# Patient Record
Sex: Male | Born: 1945 | Race: White | Marital: Married | Smoking: Never smoker
Health system: Southern US, Academic
[De-identification: ages and names within clinical notes are randomized; demographics above are authoritative.]

## PROBLEM LIST (undated history)

## (undated) DIAGNOSIS — I2510 Atherosclerotic heart disease of native coronary artery without angina pectoris: Secondary | ICD-10-CM

## (undated) DIAGNOSIS — G43909 Migraine, unspecified, not intractable, without status migrainosus: Secondary | ICD-10-CM

## (undated) DIAGNOSIS — I10 Essential (primary) hypertension: Secondary | ICD-10-CM

## (undated) DIAGNOSIS — E7800 Pure hypercholesterolemia, unspecified: Secondary | ICD-10-CM

## (undated) HISTORY — PX: HX NO SURGICAL PROCEDURES: 2100001501

## (undated) HISTORY — DX: Migraine, unspecified, not intractable, without status migrainosus: G43.909

## (undated) HISTORY — DX: Essential (primary) hypertension: I10

## (undated) HISTORY — DX: Pure hypercholesterolemia, unspecified: E78.00

## (undated) HISTORY — DX: Atherosclerotic heart disease of native coronary artery without angina pectoris: I25.10

---

## 1994-02-15 ENCOUNTER — Other Ambulatory Visit (HOSPITAL_COMMUNITY): Payer: Self-pay | Admitting: Family Medicine

## 2014-11-20 DIAGNOSIS — K635 Polyp of colon: Secondary | ICD-10-CM | POA: Insufficient documentation

## 2021-04-20 IMAGING — MR MRI LUMBAR SPINE WITHOUT CONTRAST
5 of 6 series · 33 of 48 positions shown · non-contrast
Comparison: None previous available.

﻿EXAM:  [id number]   MRI LUMBAR SPINE WITHOUT CONTRAST
INDICATION: 75-year-old with persistent low back pain.
TECHNIQUE: Axial, coronal and sagittal images including T1, T2 and STIR sequences.

[Series 6: T1 · sagittal · 4.0mm · 0.87mm/px · 6 of 13 slices shown (1 of 2)]
[im 1/13]
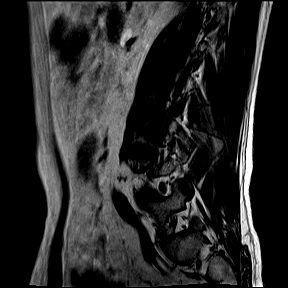
[im 3/13]
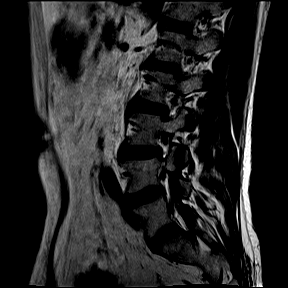
[im 5/13]
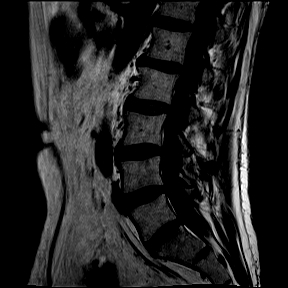
[im 8/13]
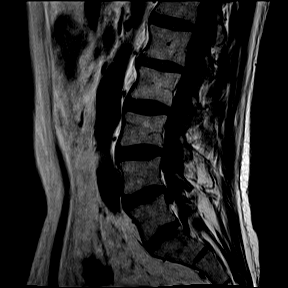
[im 10/13]
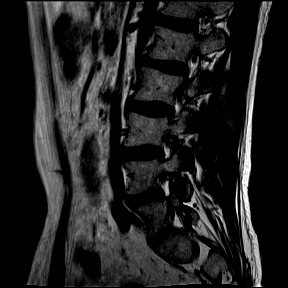
[im 13/13]
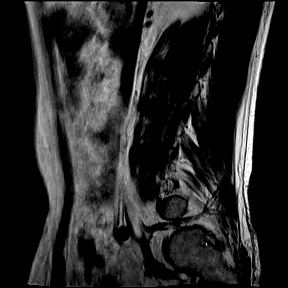

[Series 8: T2 · coronal · 5.0mm · 0.82mm/px · 8 of 18 slices shown (1 of 3)]
[im 1/18]
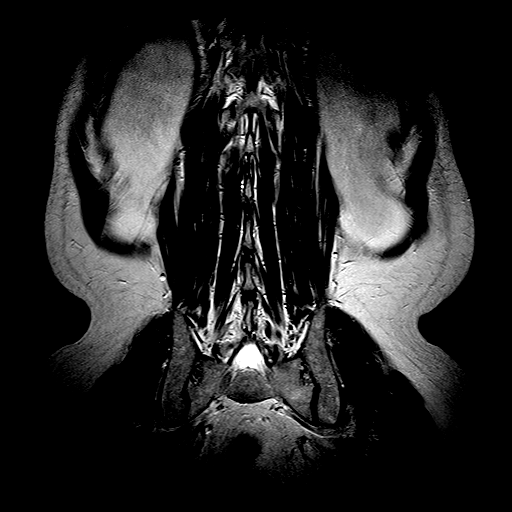
[im 3/18]
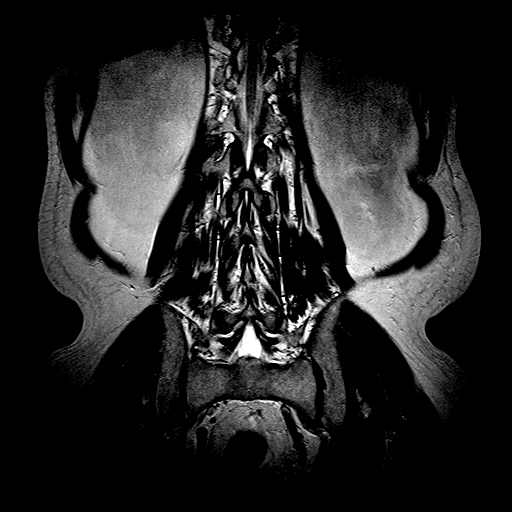
[im 5/18]
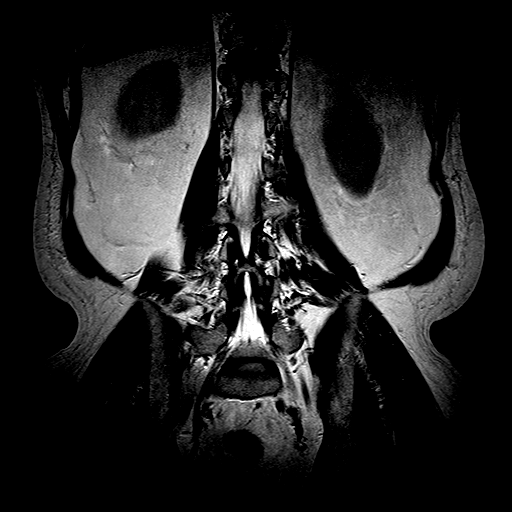
[im 8/18]
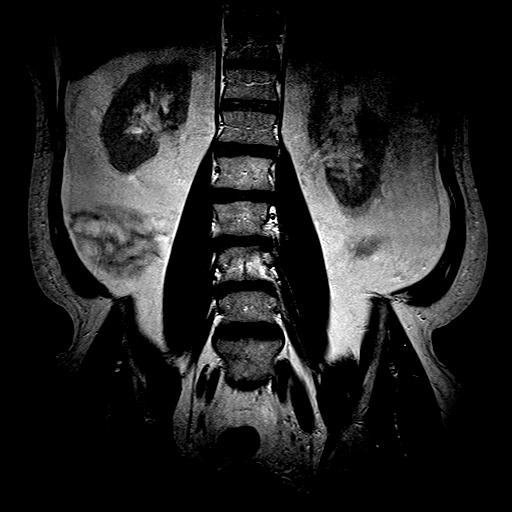
[im 10/18]
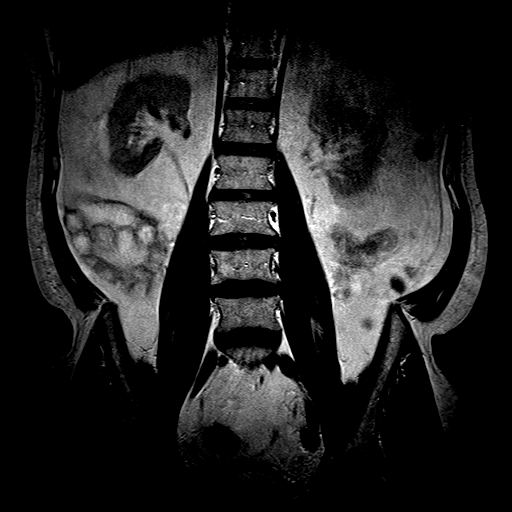
[im 13/18]
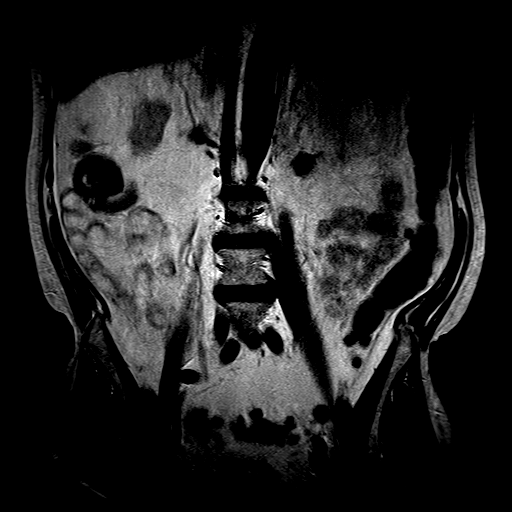
[im 15/18]
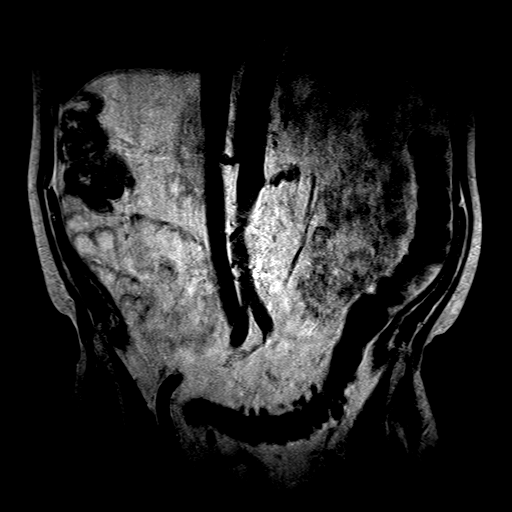
[im 18/18]
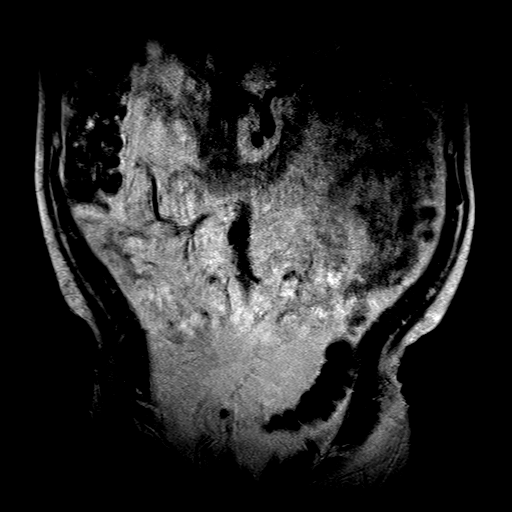

[Series 9: T2 · oblique · 4.0mm · 0.52mm/px · 11 of 23 slices shown (2 of 3)]
[im 1/23]
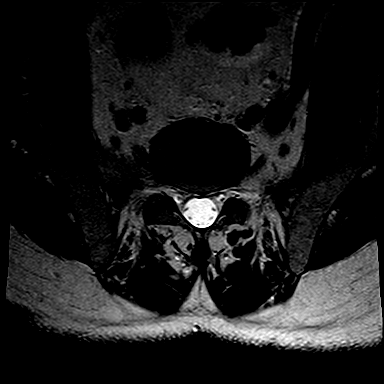
[im 3/23]
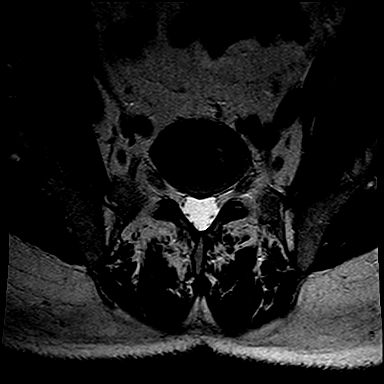
[im 5/23]
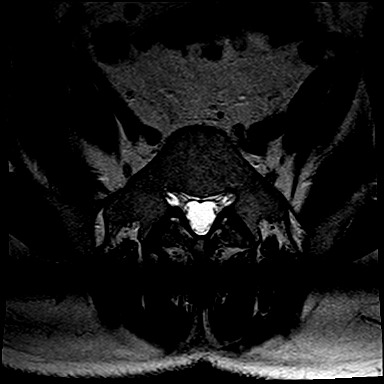
[im 7/23]
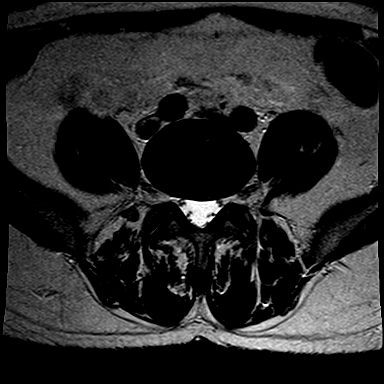
[im 9/23]
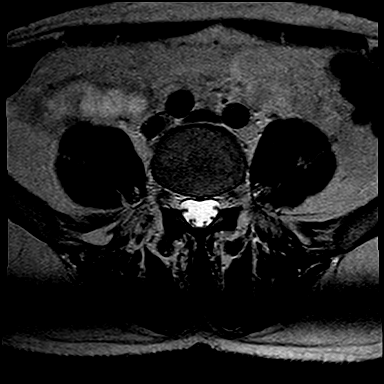
[im 12/23]
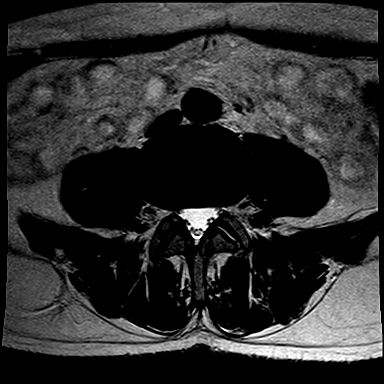
[im 14/23]
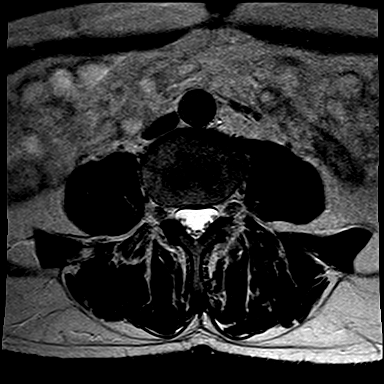
[im 16/23]
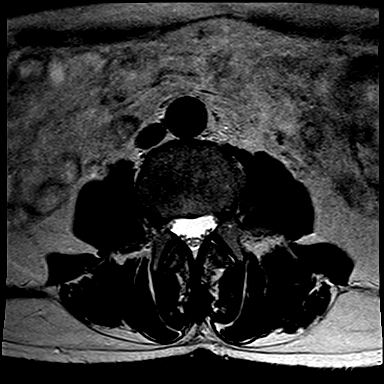
[im 18/23]
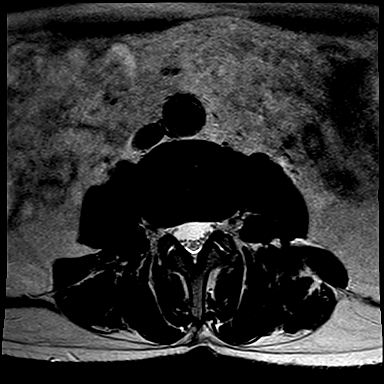
[im 20/23]
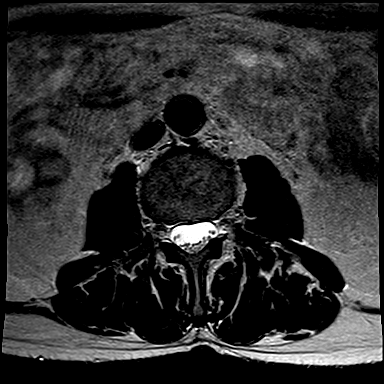
[im 23/23]
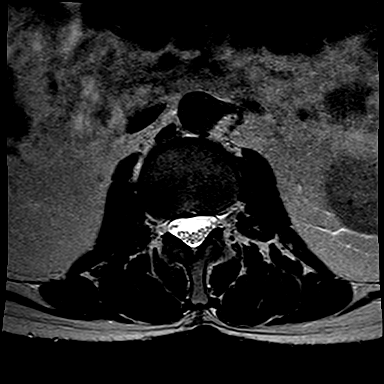

[Series 10: T2 · sagittal · 4.0mm · 0.87mm/px · 6 of 13 slices shown (3 of 3)]
[im 1/13]
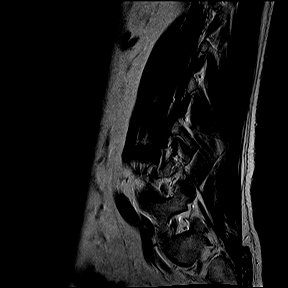
[im 3/13]
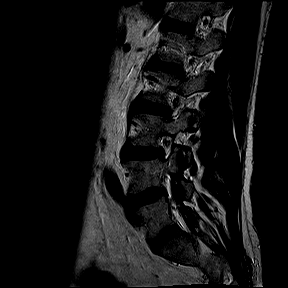
[im 5/13]
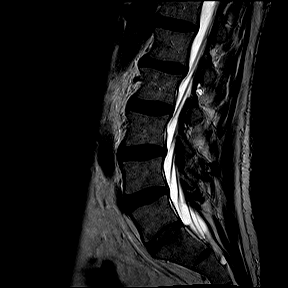
[im 8/13]
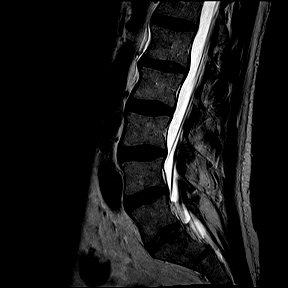
[im 10/13]
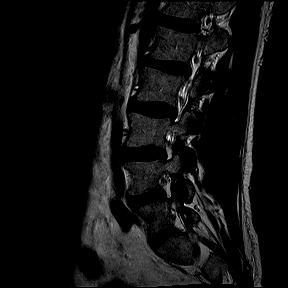
[im 13/13]
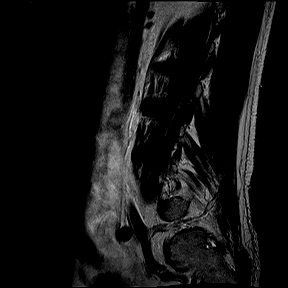

[Series 11: T1 · oblique · 4.0mm · 0.52mm/px · 2 of 23 slices shown (2 of 2)]
[im 1/23]
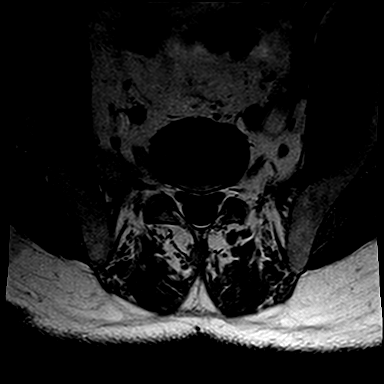
[im 5/23]
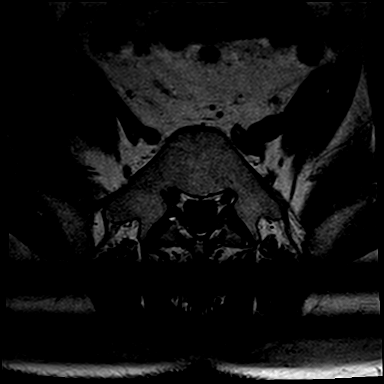

[33 of 48 positions shown; findings below may reference images not displayed]

FINDINGS: No acute bony lesions of lumbar vertebrae are seen.  Lower spinal cord and cauda equina are normal.  

At L1-L2 level, degenerative disc disease and facet arthropathy are causing mild compromise of thecal sac and both neural foramina.  Similar finding is noted at L2-L3 level and L3-L4 level as well.  

At L4-L5 level, degenerative disc changes and facet arthropathy are noted causing mild to moderate compromise of both neural foramina.  

At L5-S1 level, no focal disc lesions are seen.  

Paravertebral soft tissues are unremarkable.
IMPRESSION: 1. No acute bone changes of lumbar vertebrae. 

2. Multilevel degenerative disc changes and facet arthropathy causing mild to moderate degree of compromise of the lateral recesses and neural foramina at multiple levels as described above in detail.

## 2022-08-08 IMAGING — MR MRI KNEE RT W/O CONTRAST
5 series · 40 of 40 positions shown · IV contrast (gadolinium)
Comparison: No prior imaging studies of the knee are available for comparison.

﻿EXAM:  [id number]   MRI KNEE RT W/O CONTRAST
INDICATION: Acute onset of medial right knee pain. No history of trauma. No history of previous surgery.
TECHNIQUE: Multiplanar, multisequential MRI of the right knee was performed without gadolinium contrast.

[Series 5: PD fat-sat · axial · right · 4.0mm · 0.53mm/px · z∈[-57,+73]mm · 8 of 30 slices shown (1 of 3)]
[im 1/30]
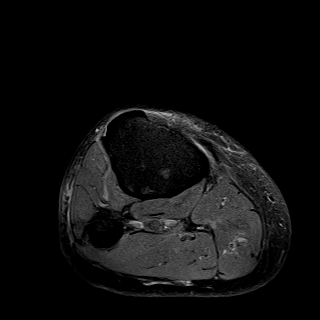
[im 5/30]
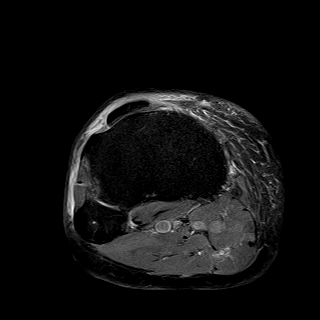
[im 9/30]
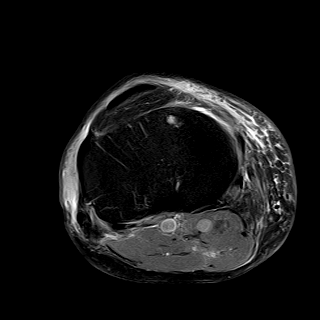
[im 13/30]
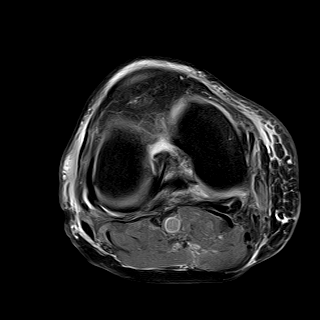
[im 17/30]
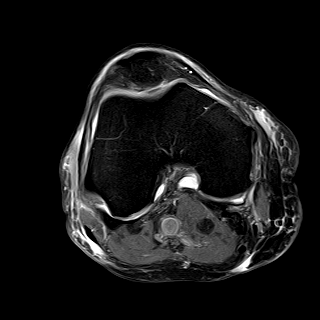
[im 21/30]
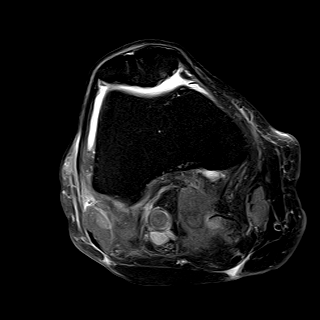
[im 25/30]
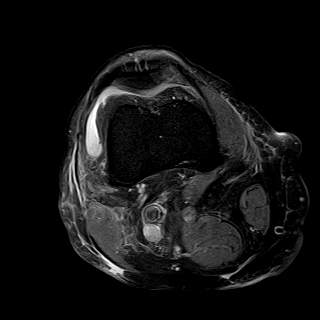
[im 30/30]
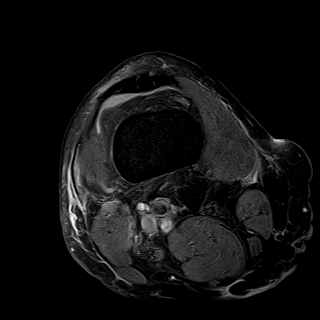

[Series 6: PD fat-sat · sagittal · right · 3.0mm · 0.47mm/px · 9 of 30 slices shown (2 of 3)]
[im 1/30]
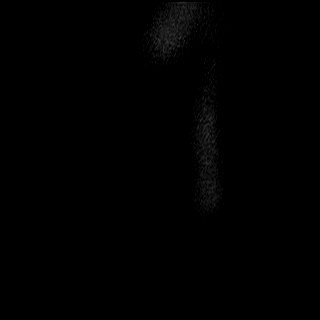
[im 4/30]
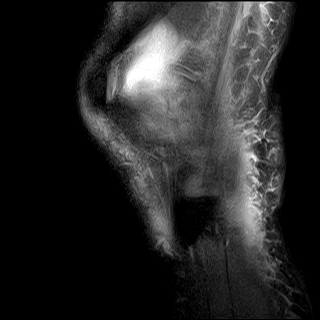
[im 8/30]
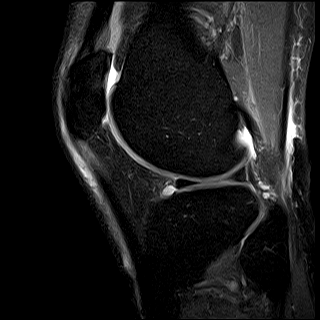
[im 11/30]
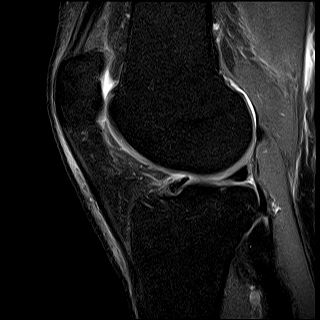
[im 15/30]
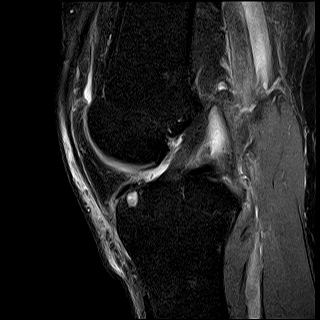
[im 19/30]
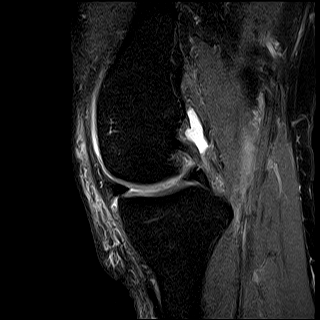
[im 22/30]
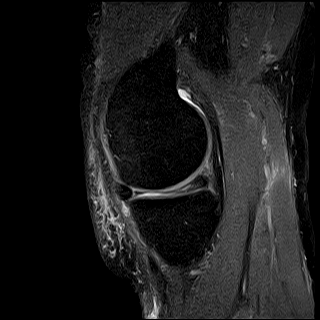
[im 26/30]
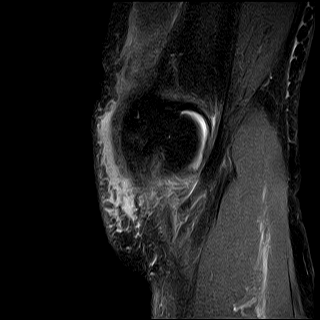
[im 30/30]
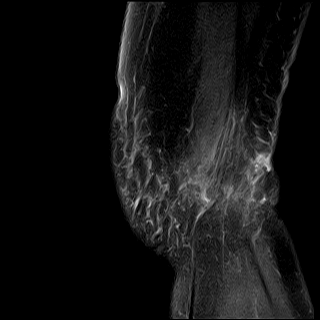

[Series 7: T1 · sagittal · right · 3.0mm · 0.39mm/px · 9 of 30 slices shown]
[im 1/30]
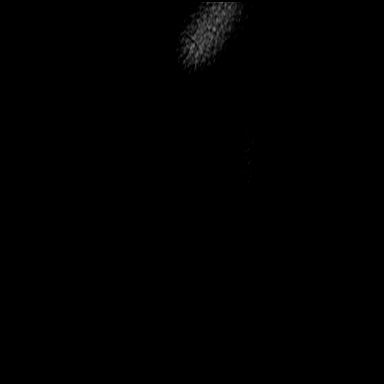
[im 4/30]
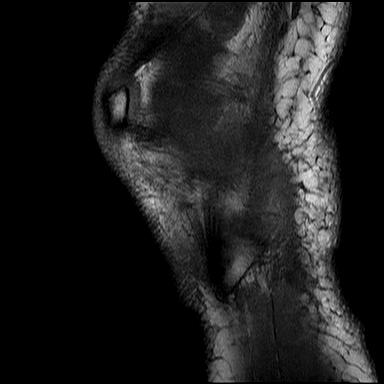
[im 8/30]
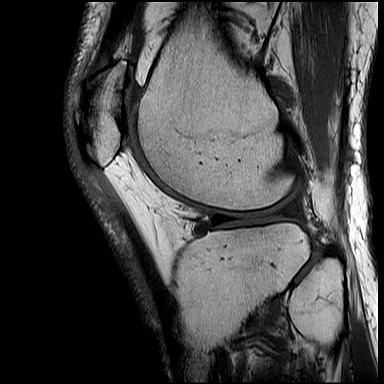
[im 11/30]
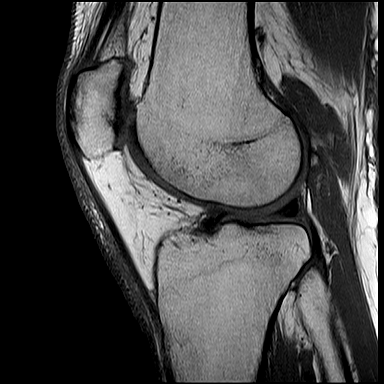
[im 15/30]
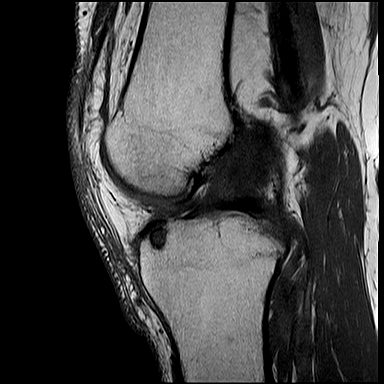
[im 19/30]
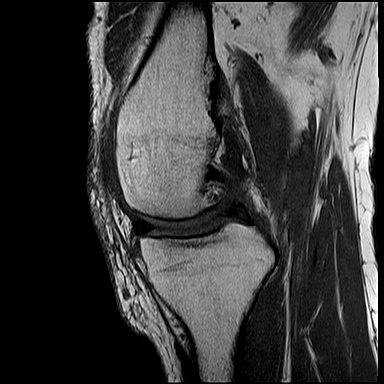
[im 22/30]
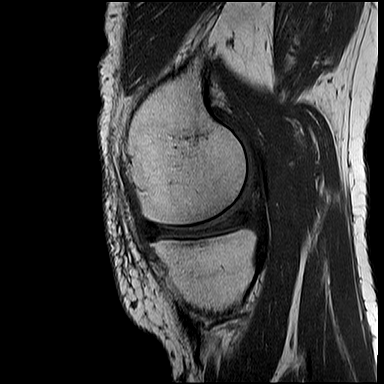
[im 26/30]
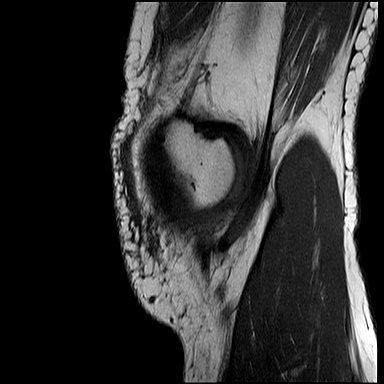
[im 30/30]
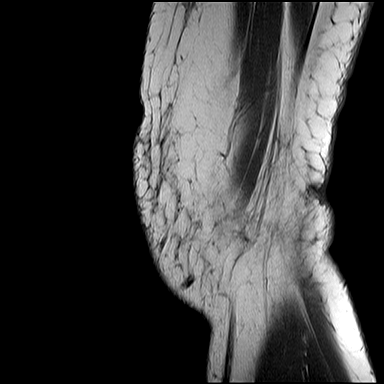

[Series 8: STIR · coronal · right · 3.5mm · 0.47mm/px · 7 of 22 slices shown]
[im 1/22]
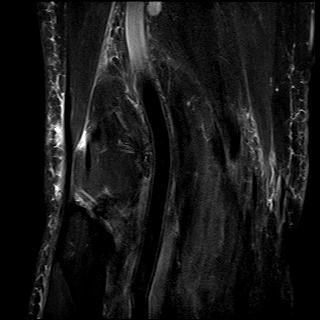
[im 4/22]
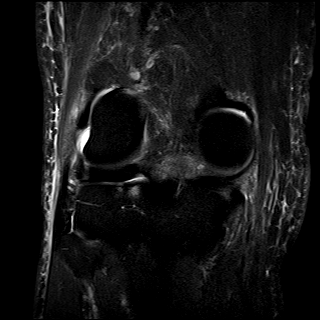
[im 8/22]
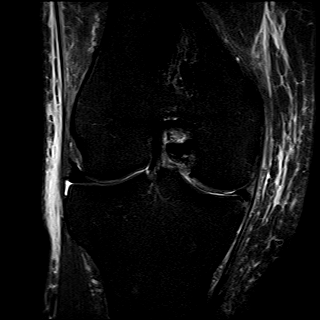
[im 11/22]
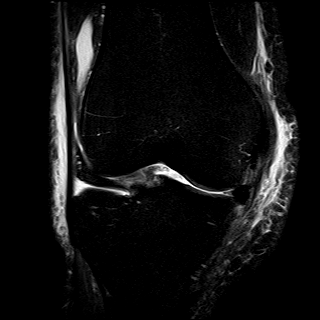
[im 15/22]
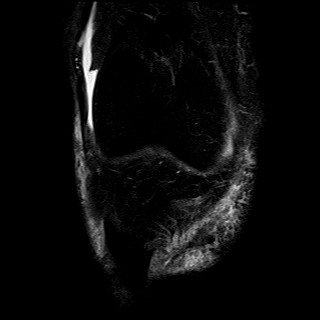
[im 18/22]
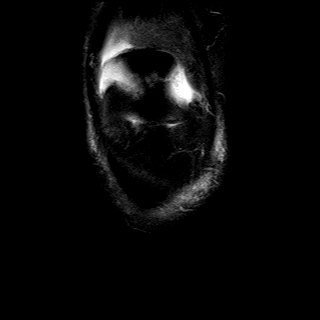
[im 22/22]
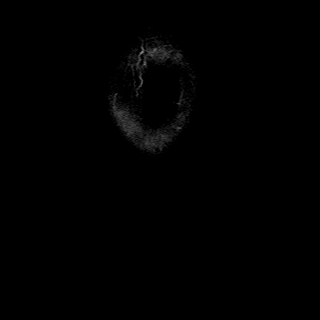

[Series 9: PD fat-sat · coronal · right · 3.5mm · 0.47mm/px · 7 of 22 slices shown (3 of 3)]
[im 1/22]
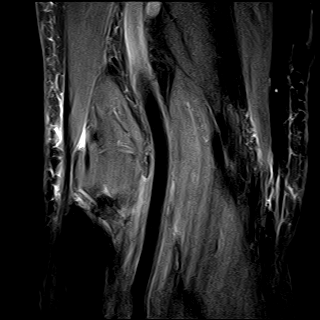
[im 4/22]
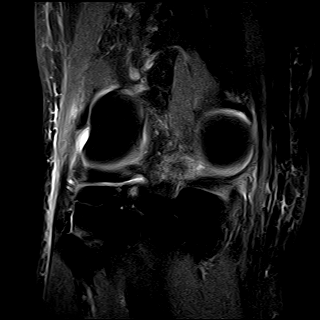
[im 8/22]
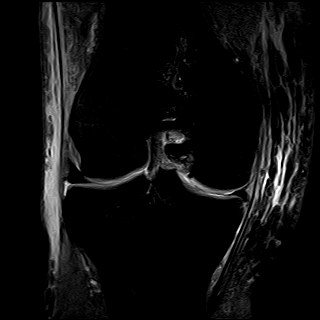
[im 11/22]
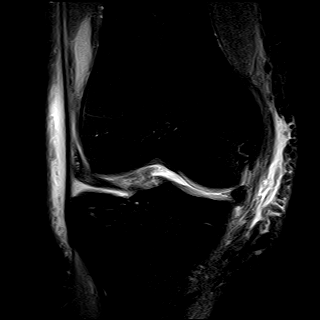
[im 15/22]
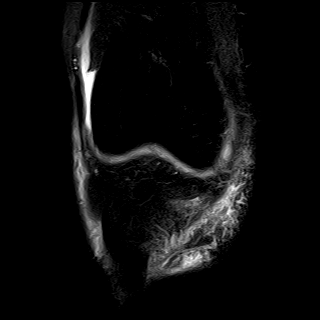
[im 18/22]
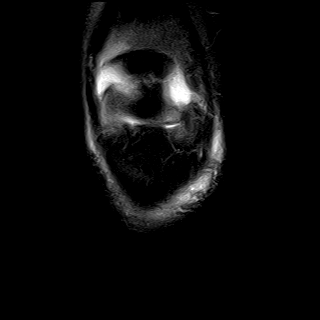
[im 22/22]
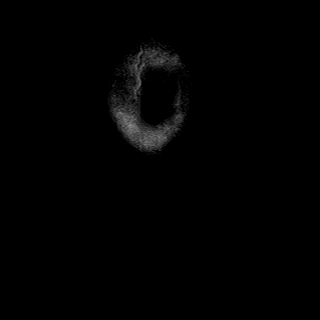

[40 of 40 positions shown; findings below may reference images not displayed]

FINDINGS: No acute fracture or bone bruise is seen on both sides of the right knee. 

Lateral meniscus and lateral articular cartilage are normal.  Anterior and posterior cruciate ligaments are intact. 

The medial meniscus shows chronic degenerative change and chronic nondisplaced tear of the mid and posterior horn of the medial meniscus. Mild medial extrusion of mid medial meniscus.  Meniscal root is intact.

Grade 3 to grade 4 degenerative changes of medial articular cartilage over the medial tibial plateau.

Medial collateral ligament is intact. Soft tissue bruise and edema is noted superficial to the medial collateral ligament.

Mild patella alta.  Significant grade 3 to grade 4 degenerative changes of patellar articular cartilage.  Small effusion is noted in the knee joint.
IMPRESSION: 1. No acute bone changes at the right knee. 

2. The medial meniscus shows chronic degenerative change and chronic nondisplaced tear of the mid and posterior horn of the medial meniscus. Mild medial extrusion of mid medial meniscus.  Meniscal root is intact.

3. Significant, grade 3 to grade 4 degenerative changes of medial articular cartilage over the medial tibial plateau and patellofemoral articular cartilage are noted. Mild patella alta.

4. Edema of the subcutaneous tissues on the medial aspect of the knee. Medial collateral ligament is intact.

## 2023-04-25 ENCOUNTER — Encounter (INDEPENDENT_AMBULATORY_CARE_PROVIDER_SITE_OTHER): Payer: Self-pay | Admitting: Internal Medicine

## 2023-04-25 ENCOUNTER — Other Ambulatory Visit: Payer: Medicare Other | Attending: Internal Medicine | Admitting: Internal Medicine

## 2023-04-25 ENCOUNTER — Other Ambulatory Visit: Payer: Self-pay

## 2023-04-25 ENCOUNTER — Ambulatory Visit (INDEPENDENT_AMBULATORY_CARE_PROVIDER_SITE_OTHER): Payer: Medicare Other | Admitting: Internal Medicine

## 2023-04-25 VITALS — BP 118/73 | HR 57 | Temp 97.8°F | Resp 20 | Ht 73.0 in | Wt 211.0 lb

## 2023-04-25 DIAGNOSIS — I2510 Atherosclerotic heart disease of native coronary artery without angina pectoris: Secondary | ICD-10-CM

## 2023-04-25 DIAGNOSIS — I10 Essential (primary) hypertension: Secondary | ICD-10-CM

## 2023-04-25 DIAGNOSIS — K5790 Diverticulosis of intestine, part unspecified, without perforation or abscess without bleeding: Secondary | ICD-10-CM

## 2023-04-25 DIAGNOSIS — I340 Nonrheumatic mitral (valve) insufficiency: Secondary | ICD-10-CM

## 2023-04-25 DIAGNOSIS — D696 Thrombocytopenia, unspecified: Secondary | ICD-10-CM | POA: Insufficient documentation

## 2023-04-25 DIAGNOSIS — N400 Enlarged prostate without lower urinary tract symptoms: Secondary | ICD-10-CM | POA: Insufficient documentation

## 2023-04-25 DIAGNOSIS — Z86010 Personal history of colonic polyps: Secondary | ICD-10-CM

## 2023-04-25 DIAGNOSIS — M1990 Unspecified osteoarthritis, unspecified site: Secondary | ICD-10-CM

## 2023-04-25 DIAGNOSIS — M47816 Spondylosis without myelopathy or radiculopathy, lumbar region: Secondary | ICD-10-CM

## 2023-04-25 DIAGNOSIS — E785 Hyperlipidemia, unspecified: Secondary | ICD-10-CM

## 2023-04-25 DIAGNOSIS — I7121 Aneurysm of the ascending aorta, without rupture (CMS HCC): Secondary | ICD-10-CM

## 2023-04-25 DIAGNOSIS — R946 Abnormal results of thyroid function studies: Secondary | ICD-10-CM

## 2023-04-25 DIAGNOSIS — K8020 Calculus of gallbladder without cholecystitis without obstruction: Secondary | ICD-10-CM

## 2023-04-25 DIAGNOSIS — N281 Cyst of kidney, acquired: Secondary | ICD-10-CM

## 2023-04-25 LAB — COMPREHENSIVE METABOLIC PANEL, NON-FASTING
ALBUMIN/GLOBULIN RATIO: 1.9 — ABNORMAL HIGH (ref 0.8–1.4)
ALBUMIN: 4.5 g/dL (ref 3.5–5.7)
ALKALINE PHOSPHATASE: 57 U/L (ref 34–104)
ALT (SGPT): 17 U/L (ref 7–52)
ANION GAP: 5 mmol/L (ref 4–13)
AST (SGOT): 21 U/L (ref 13–39)
BILIRUBIN TOTAL: 1 mg/dL (ref 0.3–1.2)
BUN/CREA RATIO: 16 (ref 6–22)
BUN: 16 mg/dL (ref 7–25)
CALCIUM, CORRECTED: 9.1 mg/dL (ref 8.9–10.8)
CALCIUM: 9.5 mg/dL (ref 8.6–10.3)
CHLORIDE: 108 mmol/L — ABNORMAL HIGH (ref 98–107)
CO2 TOTAL: 27 mmol/L (ref 21–31)
CREATININE: 0.99 mg/dL (ref 0.60–1.30)
ESTIMATED GFR: 79 mL/min/{1.73_m2} (ref >59)
GLOBULIN: 2.4 — ABNORMAL LOW (ref 2.9–5.4)
GLUCOSE: 99 mg/dL (ref 74–109)
OSMOLALITY, CALCULATED: 281 mOsm/kg (ref 270–290)
POTASSIUM: 4.3 mmol/L (ref 3.5–5.1)
PROTEIN TOTAL: 6.9 g/dL (ref 6.4–8.9)
SODIUM: 140 mmol/L (ref 136–145)

## 2023-04-25 LAB — LIPID PANEL
CHOL/HDL RATIO: 3
CHOLESTEROL: 130 mg/dL (ref <200)
HDL CHOL: 43 mg/dL (ref >=40)
LDL CALC: 71 mg/dL (ref 0–100)
TRIGLYCERIDES: 78 mg/dL (ref <=150)
VLDL CALC: 16 mg/dL (ref 0–50)

## 2023-04-25 LAB — CBC WITH DIFF
BASOPHIL #: 0 10*3/uL (ref 0.00–0.10)
BASOPHIL %: 1 % (ref 0–1)
EOSINOPHIL #: 0.1 10*3/uL (ref 0.00–0.50)
EOSINOPHIL %: 1 %
HCT: 39.5 % (ref 36.7–47.1)
HGB: 13.5 g/dL (ref 12.5–16.3)
LYMPHOCYTE #: 0.8 10*3/uL — ABNORMAL LOW (ref 1.00–3.00)
LYMPHOCYTE %: 16 % (ref 16–44)
MCH: 31.8 pg (ref 23.8–33.4)
MCHC: 34.1 g/dL (ref 32.5–36.3)
MCV: 93.3 fL (ref 73.0–96.2)
MONOCYTE #: 0.4 10*3/uL (ref 0.30–1.00)
MONOCYTE %: 9 % (ref 5–13)
MPV: 9.6 fL (ref 7.4–11.4)
NEUTROPHIL #: 3.5 10*3/uL (ref 1.85–7.80)
NEUTROPHIL %: 72 % (ref 43–77)
PLATELETS: 111 10*3/uL — ABNORMAL LOW (ref 140–440)
RBC: 4.23 10*6/uL (ref 4.06–5.63)
RDW: 14.4 % (ref 12.1–16.2)
WBC: 4.8 10*3/uL (ref 3.6–10.2)

## 2023-04-25 LAB — THYROID STIMULATING HORMONE (SENSITIVE TSH): TSH: 4.415 u[IU]/mL (ref 0.450–5.330)

## 2023-04-25 NOTE — Nursing Note (Signed)
04/25/23 1001   Recent Weight Change   Have you had a recent unexplained weight loss or gain? N   Health Education and Literacy   How often do you have a problem understanding what is told to you about your medical condition?  Never   Domestic Violence   Because we are aware of abuse and domestic violence today, we ask all patients: Are you being hurt, hit, or frightened by anyone at your home or in your life?  N   Basic Needs   Do you have any basic needs within your home that are not being met? (such as Food, Shelter, [employer] fast streamer, Tranportation, paying for bills and/or medications) N   Advanced Directives   Do you have any advanced directives? No Advance   Would you like an advanced directive packet? Refused Packet

## 2023-04-25 NOTE — Nursing Note (Signed)
04/25/23 1002   Fall Risk Assessment   Do you feel unsteady when standing or walking? No   Do you worry about falling? No   Have you fallen in the past year? No

## 2023-04-25 NOTE — Progress Notes (Signed)
FAMILY MEDICINE, MEDICAL OFFICE BUILDING  [address]  [place] [address]      Seth Esparza  [birth year]  [id number]    Date of Service: 04/25/2023 10:00 AM EDT    Chief complaint:   Chief Complaint   Patient presents with    Follow Up    Labs Only       Subjective:     This is a case of a 77 y.o. year old male who comes in today to establish care.     Pt is followed by Dr. Reuel Boom at [facility] for BPH.  Last visit October 23. Nl exam.  [facility] annual visit  09/24  Colon 12/18/19  FH colon CA father & personal polyps [facility]  Dr. Erby Pian increased Atorvastatin to 40 mg in February.  [facility] sees him twice a year.     PSA  1.66    Last TSH 6.03    Recent lab results from January, x-rays and exam w/ Dr. Avel Peace.     He takes Amoxil prophylactic prior to dental work.      His main issue is his low back and mitral valve regurg.  He will have an echo w/ [facility].      His sister died quickly this year and it was assumed that perhaps she had due a PE.  But the family thinks it could have been aneurysm related as his brother also  has a  known aneurysm.    The most exercise he gets is mowing his yard.  He is limited in his walking in large part due to his back. 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     He notes some ankle swelling after being up for awhile.     ROS:  Constitutional - no appetite or weight changes, no fatigue, no fevers, chills, or night sweats  Respiratory - no dyspnea or cough  Cardiovascular - no chest pain or palpitations  Gastrointestinal - no nausea, vomiting, diarrhea, or constipation, no dyspepsia  Endocrine - no heat or cold intolerance, no polyuria, polydipsia, or polyphagia  Skin - no rashes, color changes, or lesions  Musculoskeletal - no arthralgias or myalgias  Genitourinary - no urinary frequency or dysuria, no genital discharge  Neurologic - no vision or hearing changes, no weakness, no parasthesias   Psychiatric - mood has been appropriate, no depression or  anxiety    Active Ambulatory Problems     Diagnosis Date Noted    Colon polyps 11/20/2014    Hypertension 04/25/2023    Hyperlipidemia 04/25/2023    BPH (benign prostatic hyperplasia) 04/25/2023    Mitral regurgitation 04/25/2023    Aneurysm, ascending aorta (CMS HCC) 04/25/2023    Degenerative arthritis 04/25/2023    Coronary atherosclerosis 04/25/2023    Renal cysts, acquired, 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    Diverticulosis 04/25/2023    Cholelithiasis 04/25/2023     Resolved Ambulatory Problems     Diagnosis Date Noted    No Resolved Ambulatory Problems     Past Medical History:   Diagnosis Date    Coronary artery disease     Hypercholesterolemia     Migraine        Past Surgical History:   Procedure Laterality Date    HX NO SURGICAL PROCEDURES             Current Outpatient Medications   Medication Sig    alfuzosin (UROXATRAL) 10 mg Oral Tablet Sustained Release 24 hr Take 1 Tablet (10 mg  total) by mouth Every night    amoxicillin (AMOXIL) 500 mg Oral Capsule Take 1 Capsule (500 mg total) by mouth Once per day as needed    ascorbic acid, vitamin C, (VITAMIN C) 500 mg Oral Tablet Take 2 Tablets (1,000 mg total) by mouth Once a day    aspirin (ECOTRIN) 81 mg Oral Tablet, Delayed Release (E.C.) Take 1 Tablet (81 mg total) by mouth Once a day    atenoloL (TENORMIN) 25 mg Oral Tablet Take 1 Tablet (25 mg total) by mouth Every morning    atorvastatin (LIPITOR) 40 mg Oral Tablet Take 1 Tablet (40 mg total) by mouth Once a day    Cholecalciferol, Vitamin D3, 50 mcg (2,000 unit) Oral Capsule Take 2,000 mcg by mouth Once a day    cyanocobalamin (VITAMIN B-12) 500 mcg Oral Tablet Take 2 Tablets (1,000 mcg total) by mouth Once a day    losartan (COZAAR) 50 mg Oral Tablet Take 1 Tablet (50 mg total) by mouth Once a day    multivitamin (THERA) Oral Tablet Take 1 Tablet by mouth Once a day    zinc gluconate 50 mg Oral Tablet Take 1 Tablet (50 mg total) by mouth Once a day       Objective:     BP 118/73 (Site: Left, Patient Position:  Sitting, Cuff Size: Adult)   Pulse 57   Temp 36.6 C (97.8 F) (Temporal)   Resp 20   Ht 1.854 m (6\' 1" )   Wt 95.7 kg (211 lb)   SpO2 98%   BMI 27.84 kg/m        General appearance: alert, cooperative, in no acute distress  HEENT:  no LAD or thyromegaly, neck supple.  Lungs: clear to auscultation bilaterally; no wheezes or rhonchi   Heart: regular rate and rhythm; normal S1 & S2; no murmur  Extremities: extremities normal ROM, no cyanosis or edema , no rash  Vascular: carotid pulses are normal w/o bruit.  Pt is waring compression stockings.  Psych: alert and oriented x 3  Neuro: CN 2-12 grossly intact  Peripheral motor and sensory exams are grossly normal    Assessment/Plan         ICD-10-CM    1. Hypertension  I10 THYROID STIMULATING HORMONE (SENSITIVE TSH)      2. Aneurysm, ascending aorta (CMS HCC)  I71.21       3. Hyperlipidemia  E78.5 COMPREHENSIVE METABOLIC PANEL, NON-FASTING      4. BPH (benign prostatic hyperplasia)  N40.0       5. Borderline abnormal TFTs  R94.6       6. Hyperlipidemia, unspecified hyperlipidemia type  E78.5 LIPID PANEL      7. Thrombocytopenia (CMS HCC)  D69.6 CBC/DIFF      8. Nonrheumatic mitral valve regurgitation  I34.0       9. History of colon polyps  Z86.010       10. 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 Degenerative arthritis  M19.90       12. Coronary atherosclerosis  I25.10       13. Renal cysts, acquired, bilateral  N28.1       14. Diverticulosis  K57.90       15. Cholelithiasis  K80.20           Orders Placed This Encounter    LIPID PANEL    COMPREHENSIVE METABOLIC PANEL, NON-FASTING    CBC/DIFF    THYROID STIMULATING HORMONE (  SENSITIVE TSH)    CBC WITH DIFF       Health Maintenance:   Depression screening is negative. PHQ 2 Total: 0       Pt will cont. F/u with all of his usual providers and he will have annual complete exam at [facility].             The patient was given the opportunity to ask questions and those questions were answered to the  patient's satisfaction. The patient was encouraged to call with any additional questions or concerns. Instructed patient to call back if symptoms worse.   Discussed with patient effects and side effects of medications. Medication safety was discussed. A copy of the patient's medication list was printed and given to the patient. A good faith effort was made to reconcile the patient's medications.       Follow up: Return in about 1 year (around 04/24/2024).    Magdalene Patricia, MD

## 2023-04-25 NOTE — Nursing Note (Signed)
04/25/23 1002   Depression Screen   Little interest or pleasure in doing things. 0   Feeling down, depressed, or hopeless 0   PHQ 2 Total 0

## 2023-05-07 ENCOUNTER — Encounter (INDEPENDENT_AMBULATORY_CARE_PROVIDER_SITE_OTHER): Payer: Self-pay | Admitting: Internal Medicine

## 2023-05-07 DIAGNOSIS — H269 Unspecified cataract: Secondary | ICD-10-CM | POA: Insufficient documentation

## 2023-05-08 ENCOUNTER — Other Ambulatory Visit (INDEPENDENT_AMBULATORY_CARE_PROVIDER_SITE_OTHER): Payer: Self-pay

## 2023-10-14 ENCOUNTER — Encounter (INDEPENDENT_AMBULATORY_CARE_PROVIDER_SITE_OTHER): Payer: Self-pay | Admitting: Internal Medicine

## 2024-01-29 ENCOUNTER — Encounter (INDEPENDENT_AMBULATORY_CARE_PROVIDER_SITE_OTHER): Payer: Self-pay | Admitting: Internal Medicine

## 2024-02-29 DIAGNOSIS — K760 Fatty (change of) liver, not elsewhere classified: Secondary | ICD-10-CM | POA: Insufficient documentation

## 2024-04-03 ENCOUNTER — Encounter (INDEPENDENT_AMBULATORY_CARE_PROVIDER_SITE_OTHER): Payer: Self-pay | Admitting: Internal Medicine

## 2024-04-13 ENCOUNTER — Other Ambulatory Visit (INDEPENDENT_AMBULATORY_CARE_PROVIDER_SITE_OTHER): Payer: Self-pay

## 2024-04-17 ENCOUNTER — Other Ambulatory Visit (INDEPENDENT_AMBULATORY_CARE_PROVIDER_SITE_OTHER): Payer: Self-pay | Admitting: Internal Medicine

## 2024-04-17 DIAGNOSIS — I10 Essential (primary) hypertension: Secondary | ICD-10-CM

## 2024-05-01 ENCOUNTER — Ambulatory Visit (INDEPENDENT_AMBULATORY_CARE_PROVIDER_SITE_OTHER): Payer: Self-pay | Admitting: Internal Medicine

## 2024-05-01 ENCOUNTER — Other Ambulatory Visit: Payer: Self-pay

## 2024-05-01 ENCOUNTER — Ambulatory Visit: Payer: Self-pay | Attending: Internal Medicine | Admitting: Internal Medicine

## 2024-05-01 ENCOUNTER — Encounter (INDEPENDENT_AMBULATORY_CARE_PROVIDER_SITE_OTHER): Payer: Self-pay | Admitting: Internal Medicine

## 2024-05-01 VITALS — BP 116/71 | HR 61 | Temp 97.4°F | Ht 73.0 in | Wt 216.0 lb

## 2024-05-01 VITALS — BP 116/71 | Wt 216.0 lb

## 2024-05-01 DIAGNOSIS — E538 Deficiency of other specified B group vitamins: Secondary | ICD-10-CM | POA: Insufficient documentation

## 2024-05-01 DIAGNOSIS — I340 Nonrheumatic mitral (valve) insufficiency: Secondary | ICD-10-CM | POA: Insufficient documentation

## 2024-05-01 DIAGNOSIS — Z860100 Personal history of colon polyps, unspecified: Secondary | ICD-10-CM

## 2024-05-01 DIAGNOSIS — Z0000 Encounter for general adult medical examination without abnormal findings: Secondary | ICD-10-CM

## 2024-05-01 DIAGNOSIS — M1990 Unspecified osteoarthritis, unspecified site: Secondary | ICD-10-CM | POA: Insufficient documentation

## 2024-05-01 DIAGNOSIS — I10 Essential (primary) hypertension: Secondary | ICD-10-CM | POA: Insufficient documentation

## 2024-05-01 DIAGNOSIS — I7121 Aneurysm of the ascending aorta, without rupture: Secondary | ICD-10-CM | POA: Insufficient documentation

## 2024-05-01 DIAGNOSIS — K635 Polyp of colon: Secondary | ICD-10-CM | POA: Insufficient documentation

## 2024-05-01 MED ORDER — CYANOCOBALAMIN (VIT B-12) 1,000 MCG TABLET
1000.0000 ug | ORAL_TABLET | ORAL | Status: AC
Start: 2024-05-01 — End: ?

## 2024-05-01 NOTE — Progress Notes (Signed)
 FAMILY MEDICINE, MEDICAL OFFICE BUILDING  [address]  [place] [address]  Operated by [facility]  Medicare Annual Wellness Visit    Name: Seth Esparza MRN:  [id number]   Date: 05/01/2024 Age: 78 y.o.       SUBJECTIVE:   Seth Esparza is a 78 y.o. male for presenting for Medicare Wellness exam.   I have reviewed and reconciled the medication list with the patient today.        05/01/2024     9:13 AM   Comprehensive Health Assessment-Adult   Do you wish to complete this form? Yes   During the past 4 weeks, how would you rate your health in general? Very Good   During the past 4 weeks, how much difficulty have you had doing your usual activities inside and outside your home because of medical or emotional problems? No difficulty at all   During the past 4 weeks, was someone available to help you if you needed and wanted help? Yes, as much as I wanted   In the past year, how many times have you gone to the emergency department or been admitted to a hospital for a health problem? None   Are you generally satisfied with your sleep? Yes   Do you have enough money to buy things you need in everyday life, such as food, clothing, medicines, and housing? Yes, always   Can you get to places beyond walking distance without help?  (For example, can you drive your own car or travel alone on buses)? Yes   Do you fasten your seatbelt when you are in a car? Yes, usually   Do you exercise 20 minutes 3 or more days per week (such as walking, dancing, biking, mowing grass, swimming)? No, I usually don't exercise this much   How often do you eat food that is healthy (fruits, vegetables, lean meats) instead of unhealthy (sweets, fast food, junk food, fatty foods)? Almost never   Have your parents, brothers or sisters had any of the following problems before the age of 72? (check all that apply) Cancer   How often do you have trouble taking medicines the eay you are told to take them? I always take them as  prescribed   Do you need any help communicating with your doctors and nurses because of vision or hearing problems? No   During the past 12 months, have you experienced confusion or memory loss that is happening more often or is getting worse? No   Do you have one person you think of as your personal doctor (primary care provider or family doctor)? Yes   If you are seeing a Primary Care Provider (PCP) or family doctor. please list their name Dr. Charon Copper   Are you now also seeing any specialist physician(s) (such as eye doctor, foot doctor, skin doctor)? Yes   If you are seeing a specialist for anything such as foot, eye, skin, etc.  please list their name(s) eye doctor, heart doctor   How confident are you that you can control or manage most of your health problems? Very confident       I have reviewed and updated as appropriate the past medical, family and social history. 05/01/2024 as summarized below:  Past Medical History:   Diagnosis Date    Coronary artery disease     Hypercholesterolemia     Hypertension     Migraine      Past Surgical History:   Procedure  Laterality Date    Hx no surgical procedures       Current Outpatient Medications   Medication Sig    alfuzosin (UROXATRAL) 10 mg Oral Tablet Sustained Release 24 hr Take 1 Tablet (10 mg total) by mouth Every night    amoxicillin (AMOXIL) 500 mg Oral Capsule Take 1 Capsule (500 mg total) by mouth Once per day as needed    ascorbic acid, vitamin C, (VITAMIN C) 500 mg Oral Tablet Take 2 Tablets (1,000 mg total) by mouth Once a day    aspirin (ECOTRIN) 81 mg Oral Tablet, Delayed Release (E.C.) Take 1 Tablet (81 mg total) by mouth Once a day    atenoloL (TENORMIN) 25 mg Oral Tablet Take 1 Tablet (25 mg total) by mouth Every morning    atorvastatin (LIPITOR) 40 mg Oral Tablet Take 1 Tablet (40 mg total) by mouth Once a day    Cholecalciferol, Vitamin D3, 50 mcg (2,000 unit) Oral Capsule Take 2,000 mcg by mouth Once a day    losartan (COZAAR) 50 mg Oral Tablet  Take 1 Tablet (50 mg total) by mouth Once a day    multivitamin (THERA) Oral Tablet Take 1 Tablet by mouth Once a day (Patient not taking: Reported on 05/01/2024)    zinc gluconate 50 mg Oral Tablet Take 1 Tablet (50 mg total) by mouth Once a day     Family Medical History:       Problem Relation (Age of Onset)    Colon Cancer Father    Esophageal cancer Mother    Pulmonary embolism Sister            Social History     Socioeconomic History    Marital status: Married   Tobacco Use    Smoking status: Never    Smokeless tobacco: Never   Vaping Use    Vaping status: Never Used   Substance and Sexual Activity    Drug use: Never     Social Determinants of Health     Health Literacy: Low Risk  (05/01/2024)    Health Literacy     SDOH Health Literacy: Never         List of Current Health Care Providers   Care Team       PCP       Name Type Specialty Phone Number    Rebbeca Campi, MD Physician [facility] [phone]              Care Team       No care team found                      Health Maintenance   Topic Date Due    Hepatitis C screening  Never done    Medicare Annual Wellness Visit  Never done    Covid-19 Vaccine (4 - 2024-25 season) 07/15/2023    Depression Screening  05/01/2025    Adult Tdap-Td (2 - Td or Tdap) 08/08/2027    Influenza Vaccine  Completed    Shingles Vaccine  Completed    RSV Adult 60+ or Pregnancy  Completed    Pneumococcal Vaccination, Age 57+  Completed     Medicare Wellness Assessment   Medicare initial or wellness physical in the last year?: Yes  Advance Directives   Does patient have a living will or MPOA: No           Advance directive information given to the patient today?: Patient Declined  Activities of Daily Living   Do you need help with dressing, bathing, or walking?: No   Do you need help with shopping, housekeeping, medications, or finances?: No   Do you have rugs in hallways, broken steps, or poor lighting?: No   Do you have grab bars in your bathroom, non-slip strips  in your tub, and hand rails on your stairs?: Yes   Cognitive Function Screen (1=Yes, 0=No)   What is you age?: Correct   What is the time to the nearest hour?: Correct   What is the year?: Correct   What is the name of this clinic?: Correct   Can the patient recognize two persons (the doctor, the nurse, home help, etc.)?: Correct   What is the date of your birth? (day and month sufficient) : Correct   In what year did World War II end?: Incorrect   Who is the current president of the [place] ?: Correct   Count from 20 down to 1?: Correct   What address did I give you earlier?: Correct   Total Score: 9   Interpretation of Total Score: Greater than 6 Normal   Fall Risk Screen   Do you feel unsteady when standing or walking?: No  Do you worry about falling?: No  Have you fallen in the past year?: Yes  How many times have you fallen?: Once  Were you ever injured from falling?: Yes   Depression Screen     Little interest or pleasure in doing things.: Not at all  Feeling down, depressed, or hopeless: Not at all  PHQ 2 Total: 0     Pain Score   Pain Score:   0 - No pain    Substance Use-Abuse Screening     Tobacco Use     In Past 12 MONTHS, how often have you used any tobacco product (for example, cigarettes, e-cigarettes, cigars, pipes, or smokeless tobacco)?: Never     Alcohol use     In the PAST 12 MONTHS, how often have you had 5 (men)/4 (women) or more drinks containing alcohol in one day?: Never     Prescription Drug Use     In the PAST 12 months, how often have you used any prescription medications just for the feeling, more than prescribed, or that were not prescribed for you? Prescriptions may include: opioids, benzodiazepines, medications for ADHD: Never           Illicit Drug Use   In the PAST 12 MONTHS, how often have you used any drugs, including marijuana, cocaine or crack, heroin, methamphetamine, hallucinogens, ecstasy/MDMA?: Never        Hearing Screen   Have you noticed any hearing difficulties?:  No  After whispering 9-1-6 how many numbers did the patient repeat correctly?: 2  After whispering 4-7-8 how many numbers did the patient repeat correctly?: 3       Vision Screen             Urine Incontinence Screen   Urinary Incontinence Screen  Do you ever leak urine when you don't want to?: No                     OBJECTIVE:   BP 116/71   Wt 98 kg (216 lb)   BMI 28.50 kg/m        Other appropriate exam:    Health Maintenance Due   Topic Date Due    Hepatitis C screening  Never done  Medicare Annual Wellness Visit  Never done    Covid-19 Vaccine (4 - 2024-25 season) 07/15/2023      ASSESSMENT & PLAN:  Problem List Items Addressed This Visit    None       Identified Risk Factors/ Recommended Actions     Fall Risk Follow up plan of care: Manage & monitor hypotension  The PHQ 2 Total: 0 depression screen is interpreted as negative.        Patient declined Advanced Directives information.        No orders of the defined types were placed in this encounter.         The patient has been educated about risk factors and recommended preventive care. Written Prevention Plan completed/ updated and given to patient (see After Visit Summary).    Return in about 1 year (around 05/01/2025).    Rebbeca Campi, MD

## 2024-05-01 NOTE — Nursing Note (Signed)
 05/01/24 0913   Recent Weight Change   Have you had a recent unexplained weight loss or gain? N   Health Education and Literacy   How often do you have a problem understanding what is told to you about your medical condition?  Never   Domestic Violence   Because we are aware of abuse and domestic violence today, we ask all patients: Are you being hurt, hit, or frightened by anyone at your home or in your life?  N   Basic Needs   Do you have any basic needs within your home that are not being met? (such as Food, Shelter, [employer] fast streamer, Tranportation, paying for bills and/or medications) N   Advanced Directives   Do you have any advanced directives? No Advance   Would you like an advanced directive packet? Refused Packet

## 2024-05-01 NOTE — Assessment & Plan Note (Addendum)
 Colonoscopy will be done 01/26 and pt had 3 negative hemoccults 2025

## 2024-05-01 NOTE — Nursing Note (Signed)
 05/01/24 0910   Fall Risk Assessment   Do you feel unsteady when standing or walking? No   Do you worry about falling? No   Have you fallen in the past year? Yes   How many times have you fallen? Once   Were you ever injured from falling? Yes

## 2024-05-01 NOTE — Nursing Note (Signed)
 05/01/24 0909   Recent Weight Change   Have you had a recent unexplained weight loss or gain? N   Health Education and Literacy   How often do you have a problem understanding what is told to you about your medical condition?  Never   Domestic Violence   Because we are aware of abuse and domestic violence today, we ask all patients: Are you being hurt, hit, or frightened by anyone at your home or in your life?  N   Basic Needs   Do you have any basic needs within your home that are not being met? (such as Food, Shelter, [employer] fast streamer, Tranportation, paying for bills and/or medications) N   Advanced Directives   Do you have any advanced directives? No Advance   Would you like an advanced directive packet? Refused Packet

## 2024-05-01 NOTE — Progress Notes (Signed)
 FAMILY MEDICINE, MEDICAL OFFICE BUILDING  [address]  [place] [address]  Operated by [facility]    Seth Esparza  July 29, 1946  [id number]    Date of Service: 05/01/2024  9:00 AM EDT    Chief complaint:   Chief Complaint   Patient presents with    Follow Up       Subjective:     This is a case of a 78 y.o. year old male who comes in today for CDM.    He is most bothered by his back.  He goes weekly to the chiropractor.      He saw Dr. McLuckie last month and will have echo again in the fall.    His annual exam is at [facility] in January.  Records reviewed including labs, follow up labs, CT chest and Fibroscan.    He has f/u CBC last month because his H&H had slipped down but they are back up to his usual levels.  Platelet count is stable at 120K.  Thyroid  function stable.  Hemoccult cards neg and he will have his 5 yr colonJan 2026.     Recent lab results reviewed and noted.  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  His left wrist bothers him since.  He tapes it for relief with mowing, etc.     Platelets have varied but stable since 2018.  TSH stable sine 2010.  PSA stable since 2001    ROS:  Constitutional - no appetite or weight changes, no fatigue, no fevers, chills, or night sweats  Respiratory - no dyspnea or cough  Cardiovascular - no chest pain or palpitations  Gastrointestinal - no nausea, vomiting, diarrhea, or constipation, no dyspepsia  Endocrine - no heat or cold intolerance, no polyuria, polydipsia, or polyphagia  Skin - no rashes, color changes, or lesions  Musculoskeletal - no arthralgias or myalgias  Genitourinary - no urinary frequency or dysuria, no genital discharge  Neurologic - no vision or hearing changes, no weakness, no parasthesias   Psychiatric - mood has been appropriate, no depression or anxiety    Patient Active Problem List    Diagnosis Date Noted    Cataract 05/07/2023    Hypertension 04/25/2023    Hyperlipidemia  04/25/2023    BPH (benign prostatic hyperplasia) 04/25/2023    Mitral regurgitation 04/25/2023    Aneurysm, ascending aorta 04/25/2023    Degenerative arthritis 04/25/2023    Coronary atherosclerosis 04/25/2023    Renal cysts, acquired, 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    Diverticulosis 04/25/2023    Cholelithiasis 04/25/2023    Colon polyps 11/20/2014       Past Surgical History:   Procedure Laterality Date    HX NO SURGICAL PROCEDURES             Current Outpatient Medications   Medication Sig    alfuzosin (UROXATRAL) 10 mg Oral Tablet Sustained Release 24 hr Take 1 Tablet (10 mg total) by mouth Every night    amoxicillin (AMOXIL) 500 mg Oral Capsule Take 1 Capsule (500 mg total) by mouth Once per day as needed    ascorbic acid, vitamin C, (VITAMIN C) 500 mg Oral Tablet Take 2 Tablets (1,000 mg total) by mouth Once a day    aspirin (ECOTRIN) 81 mg Oral Tablet, Delayed Release (E.C.) Take 1 Tablet (81 mg total) by mouth Once a day    atenoloL (TENORMIN) 25 mg Oral Tablet Take 1  Tablet (25 mg total) by mouth Every morning    atorvastatin (LIPITOR) 40 mg Oral Tablet Take 1 Tablet (40 mg total) by mouth Once a day    Cholecalciferol, Vitamin D3, 50 mcg (2,000 unit) Oral Capsule Take 2,000 mcg by mouth Once a day    losartan (COZAAR) 50 mg Oral Tablet Take 1 Tablet (50 mg total) by mouth Once a day    multivitamin (THERA) Oral Tablet Take 1 Tablet by mouth Once a day (Patient not taking: Reported on 05/01/2024)    zinc gluconate 50 mg Oral Tablet Take 1 Tablet (50 mg total) by mouth Once a day       Objective:     BP 116/71   Pulse 61   Temp 36.3 C (97.4 F) (Temporal)   Ht 1.854 m (6' 1)   Wt 98 kg (216 lb)   SpO2 97%   BMI 28.50 kg/m       BMI addressed: Advised on diet, weight loss, and exercise to reduce above normal BMI.     General appearance: alert, cooperative, in no acute distress  HEENT: no LAD or thyromegaly, neck supple.  Lungs: clear to auscultation bilaterally; no wheezes or rhonchi   Heart: regular rate and  rhythm; normal S1 & S2; Gr 3/6 systolic  murmur  Extremities: extremities normal ROM, no cyanosis 1+ ankle  edema Lt > Rt , no rash  Psych: alert and oriented x 3  Neuro: CN 2-12 grossly intact  Peripheral motor and sensory exams are grossly normal    Assessment/Plan     Assessment & Plan  Colon polyps  Colonoscopy will be done 01/26 and pt had 3 negative hemoccults 2025         Mitral regurgitation  ECHO  08/24:  LV mod. Dilat. EF 60-65%. Mild conc LVH.Severe MR. Mild MVP ant. Leaflet and mod. MVP Post. Leaflet.  Trivial AR.  RV mildly dilated   followed by Dr. Gladis Lame at [facility].   Pt has no increase in his sxs and no increase in his usual LE edema.  Pt does not want open heart surgery .  He is  holding out for the clip repair to be done after age 60.             Vitamin B12 deficiency  He recalls that his B12 level was low in the past and I gave him B12 shots so I don't think he can go off of it entirely.   Orders:    cyanocobalamin (VITAMIN B-12) 1,000 mcg Oral Tablet; Take 1 Tablet (1,000 mcg total) by mouth Every other day    Degenerative arthritis  Continued chiropractic care  Rec'd regular stretching and he notes that pool therapy helps.                   Health Maintenance:   Depression screening is negative. PHQ 2 Total: 0                   The patient was given the opportunity to ask questions and those questions were answered to the patient's satisfaction. The patient was encouraged to call with any additional questions or concerns. Instructed patient to call back if symptoms worse.   Discussed with patient effects and side effects of medications. Medication safety was discussed. A copy of the patient's medication list was printed and given to the patient. A good faith effort was made to reconcile the patient's medications.  Follow up: No follow-ups on file.    Rebbeca Campi, MD

## 2024-05-01 NOTE — Patient Instructions (Signed)
 You are up to date with your vaccinations except you will be eligible for a covid booster along with flu shot this fall.    You are up to date with your colonoscopy and PSA screenings.

## 2024-05-01 NOTE — Nursing Note (Signed)
 05/01/24 0915   Medicare Wellness Assessment   Medicare initial or wellness physical in the last year? Yes   Advance Directives   Does patient have a living will or MPOA No   Advance directive information given to the patient today? Patient Declined   Activities of Daily Living   Do you need help with dressing, bathing, or walking? No   Do you need help with shopping, housekeeping, medications, or finances? No   Do you have rugs in hallways, broken steps, or poor lighting? No   Do you have grab bars in your bathroom, non-slip strips in your tub, and hand rails on your stairs? Yes   Cognitive Function Screen   What is you age? 1   What is the time to the nearest hour? 1   What is the year? 1   What is the name of this clinic? 1   Can the patient recognize two persons (the doctor, the nurse, home help, etc.)? 1   What is the date of your birth? (day and month sufficient)  1   In what year did World War II end? 0   Who is the current president of the [place] ? 1   Count from 20 down to 1? 1   What address did I give you earlier? 1   Total Score 9   Interpretation of Total Score Greater than 6 Normal   Depression Screen   Little interest or pleasure in doing things. 0   Feeling down, depressed, or hopeless 0   PHQ 2 Total 0   Pain Score   Pain Score Zero   Substance Use Screening   In Past 12 MONTHS, how often have you used any tobacco product (for example, cigarettes, e-cigarettes, cigars, pipes, or smokeless tobacco)? Never   In the PAST 12 MONTHS, how often have you had 5 (men)/4 (women) or more drinks containing alcohol in one day? Never   In the PAST 12 months, how often have you used any prescription medications just for the feeling, more than prescribed, or that were not prescribed for you? Prescriptions may include: opioids, benzodiazepines, medications for ADHD Never   In the PAST 12 MONTHS, how often have you used any drugs, including marijuana, cocaine or crack, heroin, methamphetamine, hallucinogens,  ecstasy/MDMA? Never   Hearing Screen   Have you noticed any hearing difficulties? No   After whispering 9-1-6 how many numbers did the patient repeat correctly? 2   After whispering 4-7-8 how many numbers did the patient repeat correctly? 3   Total Correct 5   Fall Risk Assessment   Do you feel unsteady when standing or walking? No   Do you worry about falling? No   Have you fallen in the past year? Yes   How many times have you fallen? Once   Were you ever injured from falling? Yes   Urinary Incontinence Screen   Do you ever leak urine when you don't want to? No

## 2024-05-01 NOTE — Nursing Note (Signed)
 05/01/24 0913   Comprehensive Health Assessment-Adult   Do you wish to complete this form? Yes   During the past 4 weeks, how would you rate your health in general? Very Good   During the past 4 weeks, how much difficulty have you had doing your usual activities inside and outside your home because of medical or emotional problems? No difficulty at all   During the past 4 weeks, was someone available to help you if you needed and wanted help? Yes, as much as I wanted   In the past year, how many times have you gone to the emergency department or been admitted to a hospital for a health problem? None   Are you generally satisfied with your sleep? Yes   Do you have enough money to buy things you need in everyday life, such as food, clothing, medicines, and housing? Yes, always   Can you get to places beyond walking distance without help?  (For example, can you drive your own car or travel alone on buses)? Yes   Do you fasten your seatbelt when you are in a car? Yes, usually   Do you exercise 20 minutes 3 or more days per week (such as walking, dancing, biking, mowing grass, swimming)? No, I usually don't exercise this much   How often do you eat food that is healthy (fruits, vegetables, lean meats) instead of unhealthy (sweets, fast food, junk food, fatty foods)? Almost never   Have your parents, brothers or sisters had any of the following problems before the age of 60? (check all that apply) Cancer   How often do you have trouble taking medicines the eay you are told to take them? I always take them as prescribed   Do you need any help communicating with your doctors and nurses because of vision or hearing problems? No   During the past 12 months, have you experienced confusion or memory loss that is happening more often or is getting worse? No   Do you have one person you think of as your personal doctor (primary care provider or family doctor)? Yes   If you are seeing a Primary Care Provider (PCP) or family  doctor. please list their name Dr. Charon Copper   Are you now also seeing any specialist physician(s) (such as eye doctor, foot doctor, skin doctor)? Yes   If you are seeing a specialist for anything such as foot, eye, skin, etc.  please list their name(s) eye doctor, heart doctor   How confident are you that you can control or manage most of your health problems? Very confident

## 2024-05-01 NOTE — Nursing Note (Signed)
 05/01/24 0910   Depression Screen   Little interest or pleasure in doing things. 0   Feeling down, depressed, or hopeless 0   PHQ 2 Total 0

## 2024-05-01 NOTE — Assessment & Plan Note (Signed)
 Continued chiropractic care  Rec'd regular stretching and he notes that pool therapy helps.

## 2024-05-01 NOTE — Assessment & Plan Note (Addendum)
 ECHO  08/24:  LV mod. Dilat. EF 60-65%. Mild conc LVH.Severe MR. Mild MVP ant. Leaflet and mod. MVP Post. Leaflet.  Trivial AR.  RV mildly dilated   followed by Dr. Gladis Lame at [facility].   Pt has no increase in his sxs and no increase in his usual LE edema.  Pt does not want open heart surgery .  He is  holding out for the clip repair to be done after age 78.

## 2024-08-26 ENCOUNTER — Ambulatory Visit (INDEPENDENT_AMBULATORY_CARE_PROVIDER_SITE_OTHER): Payer: Self-pay | Admitting: Internal Medicine

## 2024-08-26 ENCOUNTER — Other Ambulatory Visit: Payer: Self-pay

## 2024-08-26 ENCOUNTER — Ambulatory Visit: Payer: Self-pay | Attending: Internal Medicine | Admitting: Internal Medicine

## 2024-08-26 ENCOUNTER — Encounter (INDEPENDENT_AMBULATORY_CARE_PROVIDER_SITE_OTHER): Payer: Self-pay | Admitting: Internal Medicine

## 2024-08-26 VITALS — BP 124/76 | HR 68 | Temp 98.1°F | Ht 73.0 in | Wt 222.0 lb

## 2024-08-26 DIAGNOSIS — I7121 Aneurysm of the ascending aorta, without rupture: Secondary | ICD-10-CM | POA: Insufficient documentation

## 2024-08-26 DIAGNOSIS — N400 Enlarged prostate without lower urinary tract symptoms: Secondary | ICD-10-CM | POA: Insufficient documentation

## 2024-08-26 DIAGNOSIS — E038 Other specified hypothyroidism: Secondary | ICD-10-CM | POA: Insufficient documentation

## 2024-08-26 DIAGNOSIS — M4727 Other spondylosis with radiculopathy, lumbosacral region: Secondary | ICD-10-CM | POA: Insufficient documentation

## 2024-08-26 DIAGNOSIS — E785 Hyperlipidemia, unspecified: Secondary | ICD-10-CM | POA: Insufficient documentation

## 2024-08-26 DIAGNOSIS — I10 Essential (primary) hypertension: Secondary | ICD-10-CM | POA: Insufficient documentation

## 2024-08-26 LAB — CBC WITH DIFF
BASOPHIL #: 0.1 x10ˆ3/uL (ref 0.00–0.10)
BASOPHIL %: 1 % (ref 0–1)
EOSINOPHIL #: 0.1 x10ˆ3/uL (ref 0.00–0.60)
EOSINOPHIL %: 2 % (ref 1–8)
HCT: 38.5 % (ref 36.7–47.1)
HGB: 13.5 g/dL (ref 12.5–16.3)
LYMPHOCYTE #: 1.1 x10ˆ3/uL (ref 1.00–3.00)
LYMPHOCYTE %: 18 % (ref 15–43)
MCH: 32.9 pg (ref 23.8–33.4)
MCHC: 35.2 g/dL (ref 32.5–36.3)
MCV: 93.6 fL (ref 73.0–96.2)
MONOCYTE #: 0.5 x10ˆ3/uL (ref 0.30–1.10)
MONOCYTE %: 10 % (ref 6–14)
MPV: 9.7 fL (ref 7.4–11.4)
NEUTROPHIL #: 4 x10ˆ3/uL (ref 1.85–7.84)
NEUTROPHIL %: 70 % (ref 44–74)
PLATELETS: 118 x10ˆ3/uL — ABNORMAL LOW (ref 140–440)
RBC: 4.11 x10ˆ6/uL (ref 4.06–5.63)
RDW: 14.1 % (ref 12.1–16.2)
WBC: 5.8 x10ˆ3/uL (ref 3.6–10.2)

## 2024-08-26 LAB — LIPID PANEL
CHOL/HDL RATIO: 3.2
CHOLESTEROL: 132 mg/dL (ref <200)
HDL CHOL: 41 mg/dL (ref >=40)
LDL CALC: 72 mg/dL (ref 0–100)
TRIGLYCERIDES: 93 mg/dL (ref <=150)
VLDL CALC: 19 mg/dL (ref 0–50)

## 2024-08-26 LAB — COMPREHENSIVE METABOLIC PNL, FASTING
ALBUMIN/GLOBULIN RATIO: 1.9 — ABNORMAL HIGH (ref 0.8–1.4)
ALBUMIN: 4.4 g/dL (ref 3.5–5.7)
ALKALINE PHOSPHATASE: 57 U/L (ref 34–104)
ALT (SGPT): 26 U/L (ref 7–52)
ANION GAP: 5 mmol/L (ref 4–13)
AST (SGOT): 25 U/L (ref 13–39)
BILIRUBIN TOTAL: 0.8 mg/dL (ref 0.3–1.0)
BUN/CREA RATIO: 15 (ref 6–22)
BUN: 16 mg/dL (ref 7–25)
CALCIUM, CORRECTED: 9.3 mg/dL (ref 8.9–10.8)
CALCIUM: 9.6 mg/dL (ref 8.6–10.3)
CHLORIDE: 108 mmol/L — ABNORMAL HIGH (ref 98–107)
CO2 TOTAL: 26 mmol/L (ref 21–31)
CREATININE: 1.07 mg/dL (ref 0.60–1.30)
ESTIMATED GFR: 71 mL/min/1.73mˆ2 (ref >59)
GLOBULIN: 2.3 (ref 2.0–3.5)
GLUCOSE: 101 mg/dL (ref 74–109)
OSMOLALITY, CALCULATED: 279 mosm/kg (ref 270–290)
POTASSIUM: 4.4 mmol/L (ref 3.5–5.1)
PROTEIN TOTAL: 6.7 g/dL (ref 6.4–8.9)
SODIUM: 139 mmol/L (ref 136–145)

## 2024-08-26 LAB — THYROID STIMULATING HORMONE (SENSITIVE TSH): TSH: 6.322 u[IU]/mL — ABNORMAL HIGH (ref 0.450–5.330)

## 2024-08-26 NOTE — Assessment & Plan Note (Addendum)
 Atorvastatin 40 mg daily  Orders:    LIPID PANEL; Future

## 2024-08-26 NOTE — Nursing Note (Signed)
 08/26/24 0858   Fall Risk Assessment   Do you feel unsteady when standing or walking? No   Do you worry about falling? No   Have you fallen in the past year? No

## 2024-08-26 NOTE — Progress Notes (Signed)
 FAMILY MEDICINE, MEDICAL OFFICE BUILDING  [address]  [place] [address]  Operated by [facility]    Seth Esparza  05-12-1946  [id number]    Date of Service: 08/26/2024  9:00 AM EDT    Chief complaint:   Chief Complaint   Patient presents with    Follow Up 4 Months    Labs Only       Subjective:     This is a case of a 78 y.o. year old male who comes in today for CDM.    ECHO 09/25  EF 60-65%  Severe MR.    He has his aneurysm f/u at [facility] every January.    He has not seen anyone about his back since 2011 and that is his main complaint.  He has never tried injections.  His discomfort in right buttock and he has numbness down to his right foot which has been a stable symptom.    He wears compression stockings daily.          Recent lab results reviewed and noted.  TSH stable at 5  CBC stable     ROS:  Constitutional - no appetite or weight changes, no fatigue, no fevers, chills, or night sweats  Respiratory - no dyspnea or cough  Cardiovascular - no chest pain or palpitations  Gastrointestinal - no nausea, vomiting, diarrhea, or constipation, no dyspepsia  Endocrine - no heat or cold intolerance, no polyuria, polydipsia, or polyphagia  Skin - no rashes, color changes, or lesions  Musculoskeletal - no arthralgias or myalgias  Genitourinary - no urinary frequency or dysuria, no genital discharge  Neurologic - no vision or hearing changes, no weakness, no parasthesias   Psychiatric - mood has been appropriate, no depression or anxiety    Patient Active Problem List    Diagnosis Date Noted    Steatosis of liver 02/29/2024    Cataract 05/07/2023    Hypertension 04/25/2023    Hyperlipidemia 04/25/2023    BPH (benign prostatic hyperplasia) 04/25/2023    Mitral regurgitation 04/25/2023    Aneurysm, ascending aorta 04/25/2023    Degenerative arthritis 04/25/2023    Coronary atherosclerosis 04/25/2023    Renal cysts, acquired, 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    Diverticulosis 04/25/2023    Cholelithiasis  04/25/2023    Colon polyps 11/20/2014       Past Surgical History:   Procedure Laterality Date    HX NO SURGICAL PROCEDURES             Current Outpatient Medications   Medication Sig    alfuzosin (UROXATRAL) 10 mg Oral Tablet Sustained Release 24 hr Take 1 Tablet (10 mg total) by mouth Every night    amoxicillin (AMOXIL) 500 mg Oral Capsule Take 1 Capsule (500 mg total) by mouth Once per day as needed    ascorbic acid, vitamin C, (VITAMIN C) 500 mg Oral Tablet Take 2 Tablets (1,000 mg total) by mouth Once a day    aspirin (ECOTRIN) 81 mg Oral Tablet, Delayed Release (E.C.) Take 1 Tablet (81 mg total) by mouth Once a day    atenoloL (TENORMIN) 25 mg Oral Tablet Take 1 Tablet (25 mg total) by mouth Every morning    atorvastatin (LIPITOR) 40 mg Oral Tablet Take 1 Tablet (40 mg total) by mouth Once a day    Cholecalciferol, Vitamin D3, 50 mcg (2,000 unit) Oral Capsule Take 2,000 mcg by mouth Once a day    cyanocobalamin  (VITAMIN B-12) 1,000 mcg Oral Tablet Take  1 Tablet (1,000 mcg total) by mouth Every other day    losartan (COZAAR) 50 mg Oral Tablet Take 1 Tablet (50 mg total) by mouth Once a day    omega-3-DHA-EPA-fish oil (FISH OIL) 1,000 (120-180) mg Oral Capsule Take 2 Capsules by mouth Daily    zinc gluconate 50 mg Oral Tablet Take 1 Tablet (50 mg total) by mouth Once a day       Objective:     BP 124/76   Pulse 68   Temp 36.7 C (98.1 F) (Temporal)   Ht 1.854 m (6' 1)   Wt 101 kg (222 lb)   SpO2 98%   BMI 29.29 kg/m        General appearance: alert, cooperative, in no acute distress  Lungs: clear to auscultation bilaterally; no wheezes or rhonchi   Heart: regular rate and rhythm; normal S1 & S2; coarse holosystolic  murmur  Abdomen: soft, non-tender, not distended; bowel sounds present.  No palpable masses.  Extremities: extremities normal ROM, no cyanosis or edema , no rash.  Wearing compression stockings  Psych: alert and oriented x 3  Neuro: CN 2-12 grossly intact  Peripheral motor and sensory exams  are grossly normal    Assessment/Plan     Assessment & Plan  Hypertension  Controlled  Losartan 50 mg daily  Atenolol 25 mg daily  Orders:    COMPREHENSIVE METABOLIC PNL, FASTING; Future    Hyperlipidemia, unspecified hyperlipidemia type  Atorvastatin 40 mg daily  Orders:    LIPID PANEL; Future    Aneurysm of ascending aorta without rupture (CMS HCC)  Annual f/u at [facility] in January.  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   Orders:    MRI SPINE LUMBOSACRAL WO CONTRAST; Future    BPH (benign prostatic hyperplasia)  He is followed by his urologist in [place].                  Health Maintenance:   Depression screening is negative. PHQ 2 Total: 0       He will have his annual exam at [facility] in January with CT chest.             The patient was given the opportunity to ask questions and those questions were answered to the patient's satisfaction. The patient was encouraged to call with any additional questions or concerns. Instructed patient to call back if symptoms worse.   Discussed with patient effects and side effects of medications. Medication safety was discussed. A copy of the patient's medication list was printed and given to the patient. A good faith effort was made to reconcile the patient's medications.       Follow up: June    Calhoun LITTIE Na, MD

## 2024-08-26 NOTE — Nursing Note (Signed)
 08/26/24 0858   Depression Screen   Little interest or pleasure in doing things. 0   Feeling down, depressed, or hopeless 0   PHQ 2 Total 0

## 2024-08-26 NOTE — Assessment & Plan Note (Addendum)
 Annual f/u at [facility] in January.  Orders:    CBC/DIFF; Future

## 2024-08-26 NOTE — Assessment & Plan Note (Addendum)
 He is followed by his urologist in [place].
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
## 2024-08-26 NOTE — Assessment & Plan Note (Addendum)
 Controlled  Losartan 50 mg daily  Atenolol 25 mg daily  Orders:    COMPREHENSIVE METABOLIC PNL, FASTING; Future

## 2024-08-27 NOTE — Addendum Note (Signed)
 Addended by: EDSEL BENTON MATSU on: 08/27/2024 08:35 AM     Modules accepted: Orders

## 2024-09-02 ENCOUNTER — Ambulatory Visit (INDEPENDENT_AMBULATORY_CARE_PROVIDER_SITE_OTHER): Payer: Self-pay | Admitting: Internal Medicine

## 2024-09-03 ENCOUNTER — Other Ambulatory Visit (INDEPENDENT_AMBULATORY_CARE_PROVIDER_SITE_OTHER): Payer: Self-pay | Admitting: Internal Medicine

## 2024-09-03 ENCOUNTER — Encounter (INDEPENDENT_AMBULATORY_CARE_PROVIDER_SITE_OTHER): Payer: Self-pay

## 2024-09-03 DIAGNOSIS — M4726 Other spondylosis with radiculopathy, lumbar region: Secondary | ICD-10-CM

## 2024-09-18 ENCOUNTER — Other Ambulatory Visit (INDEPENDENT_AMBULATORY_CARE_PROVIDER_SITE_OTHER): Payer: Self-pay | Admitting: Internal Medicine

## 2024-09-18 ENCOUNTER — Encounter (INDEPENDENT_AMBULATORY_CARE_PROVIDER_SITE_OTHER): Payer: Self-pay | Admitting: Internal Medicine

## 2024-09-18 DIAGNOSIS — M4726 Other spondylosis with radiculopathy, lumbar region: Secondary | ICD-10-CM

## 2024-09-18 MED ORDER — PREDNISONE 20 MG TABLET
ORAL_TABLET | ORAL | 0 refills | Status: AC
Start: 2024-09-18 — End: ?

## 2024-09-18 MED ORDER — TRAMADOL 50 MG TABLET
1.0000 | ORAL_TABLET | Freq: Three times a day (TID) | ORAL | 0 refills | Status: AC | PRN
Start: 2024-09-18 — End: ?

## 2024-10-22 ENCOUNTER — Other Ambulatory Visit: Payer: Self-pay

## 2024-10-22 ENCOUNTER — Ambulatory Visit (INDEPENDENT_AMBULATORY_CARE_PROVIDER_SITE_OTHER): Payer: Self-pay

## 2024-10-22 DIAGNOSIS — M5136 Other intervertebral disc degeneration, lumbar region with discogenic back pain only: Secondary | ICD-10-CM

## 2024-10-22 DIAGNOSIS — M4726 Other spondylosis with radiculopathy, lumbar region: Secondary | ICD-10-CM

## 2024-10-22 NOTE — H&P (Unsigned)
 [facility]  [address]  [place] [address]    History and Physical    Name: Seth Esparza MRN:  [id number]   Date: 10/22/2024 DOB:  [birth year] (78 y.o.)               Provider: Omega KATHEE Sides, DO  PCP: Calhoun LITTIE Na, MD  Referring Provider: Calhoun LITTIE Na     Reason for visit: Lower Back Pain (Right side)      History of Present Illness  Seth Esparza is a 78 year old male with lumbar disc degeneration who presents with recurrent back pain. He was referred by Dr. Na for evaluation of his back pain and to assess for any major changes in his condition.    He has a history of recurrent back pain episodes, with the first significant episode occurring in 1991, described as a sudden stabbing sensation in the back, initially triggered after a shower. Chiropractic treatment helped reduce swelling and manage the pain. Another major episode occurred in 2005 while staining his deck, resulting in several days of incapacitation.    In 2022, he experienced another significant flare-up after moving stones in his yard. The pain developed about a week later, leading to several days of incapacitation. The most recent episode began in early November 2025, with a sudden onset while standing at the sink. He describes a 'heavy feeling' in the lower right hip and numbness in the right foot. He reports an 80% improvement since the onset of this episode, with no current pain but limited range of motion.    A lumbar MRI from October 2025 was performed. Prednisone  provided relief, but pain medication was ineffective and discontinued due to side effects.    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 Previously, he engaged in regular exercise, including walking and water therapy, but has not resumed these activities since the COVID-19 pandemic. He has a history of a hamstring tear in the late 1980s, contributing to his limited range of  motion.    No current pain but reports limited range of motion and a 'heavy feeling' in the lower right hip. Reports numbness in the right foot.         Past Medical History:  Past Medical History:   Diagnosis Date    Coronary artery disease     Hypercholesterolemia     Hypertension     Migraine      Past Surgical History:   Procedure Laterality Date    HX NO SURGICAL PROCEDURES        Social History     Socioeconomic History    Marital status: Married   Tobacco Use    Smoking status: Never    Smokeless tobacco: Never   Vaping Use    Vaping status: Never Used   Substance and Sexual Activity    Drug use: Never       Objective:      Physical Exam:  Vital Signs:    Gait:  Gait is intact    Posture:  Intact    Squat mechanics:  Grade 2    Scapular mechanics:   Sagittal plane:     Frontal plane:     Transverse plane:    Spinal exam:   Cervical:     Thoracic:     Lumbar:  Flexion is limited to feet from the ground.  Extension is limited 50% with low back symptoms.  Left and right side bending limited 50% with low back symptoms.    Rib/Diaphragm:  Positive apical expansion bilaterally    Pelvis/Sacrum:  Negative abductor drop test on the right.  Negative provocative SI testing bilaterally.  Symmetric ischial tuberosities bilaterally.  Symmetric sacral ILA bilaterally    Hip:  Negative provocative testing    Knee:    Foot/ankle:    Shoulder:    Elbow:    Wrist/hand:    Neuro exam:   Motor:    Cervical:        Lumbar:  5/5 bilateral L2-S1 myotomes     Sensory:    Cervical:        Lumbar:   Reflex:    Biceps:    Brachioradialis:    Triceps:    Patella:  2+ bilaterally    Medial Hamstring:    Achilles:  2+ bilaterally       Hoffman's:    Tromners:    Wartenberg thumb adduction:    Babinski:    Ankle clonus:       Tone:     Rectal:       Special Nerve tests:   Tinels:      Phalen's:     Cervical foraminal traction/compression test:      Upper extremity abduction relief:     Slump:  Negative bilaterally     SLR:     Prone knee  bend:  Negative bilaterally     Other:         Osteopathic exam:    Capsular/muscle imbalance:   Tight:  Bilateral hamstrings     Inhibited:  Bilateral glute maximus (right more than left inhibition)    Physical Exam  Ortho Exam  Objective   Neurological Exam     DIAGNOSTICS:      Results  RADIOLOGY  Lumbar spine MRI: L4-5 mild to moderate degenerative disc disease, small central annular tear, no foraminal or canal stenosis, decreased water content at L4-5, L3-4, L2-3, L1-2, T12-L1 compared to L5-S1, no fracture, no dislocation (08/29/2024)                    Assessment & Plan  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 Pain likely multifactorial. Regular exercise reduces recurrence risk by 50%.  - Initiate regular exercise routine, three hours per week.  - Consider fitness center or exercise class for accountability.  - If no improvement in 4-5 weeks, consider personal training or physical therapy.  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 No definitive diagnosis without imaging.  - Consider x-ray if symptoms persist or worsen.  - Incorporate stretching exercises, such as yoga.    Right foot numbness, possible peripheral neuropathy  Intermittent numbness in right foot, possibly peripheral neuropathy. No lumbar spine involvement on MRI. Differential includes peroneal neuropathy, piriformis syndrome, or early mild neuropathy.     Assessment & Plan  Other spondylosis with radiculopathy, lumbar region               Omega KATHEE Sides, DO     Portions of this note may be dictated using voice recognition software or a dictation service. Variances in spelling and vocabulary are possible and unintentional. Not all errors are caught/corrected. Please notify the  author if any discrepancies are noted or if the meaning of any statement is not clear.   This note was created with assistance from [employer] via capture of conversational audio. Consent was obtained from the patient and all parties present prior to recording.

## 2024-10-22 NOTE — Nursing Note (Signed)
 Patient comes into office with complaint of lower back pain. Patient advises that he has no pain at the moment but describes pain as a hurting and numbness is right side of lower back and right pinky toe and toe beside it. Patient has had a recent MRI in October and has been to a chiropractor previously.     Seth Massing, LPN  [id number] 09:48

## 2024-10-27 ENCOUNTER — Encounter (INDEPENDENT_AMBULATORY_CARE_PROVIDER_SITE_OTHER): Payer: Self-pay | Admitting: Internal Medicine

## 2024-10-29 ENCOUNTER — Other Ambulatory Visit (INDEPENDENT_AMBULATORY_CARE_PROVIDER_SITE_OTHER): Payer: Self-pay | Admitting: Internal Medicine

## 2024-10-29 MED ORDER — ATENOLOL 25 MG TABLET: 25.0000 mg | ORAL_TABLET | Freq: Every morning | ORAL | 1 refills | Status: AC

## 2025-05-04 ENCOUNTER — Ambulatory Visit (INDEPENDENT_AMBULATORY_CARE_PROVIDER_SITE_OTHER): Payer: Self-pay | Admitting: Internal Medicine
